# Patient Record
Sex: Male | Born: 1995 | Race: White | Hispanic: No | Marital: Single | Smoking: Never smoker
Health system: Southern US, Community
[De-identification: ages and names within clinical notes are randomized; demographics above are authoritative.]

## PROBLEM LIST (undated history)

## (undated) DIAGNOSIS — E669 Obesity, unspecified: Secondary | ICD-10-CM

## (undated) DIAGNOSIS — J020 Streptococcal pharyngitis: Secondary | ICD-10-CM

## (undated) HISTORY — DX: Obesity, unspecified: E66.9

---
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
## 2017-07-28 ENCOUNTER — Encounter (HOSPITAL_COMMUNITY): Payer: Self-pay | Admitting: Emergency Medicine

## 2017-07-28 ENCOUNTER — Ambulatory Visit (HOSPITAL_COMMUNITY)
Admission: EM | Admit: 2017-07-28 | Discharge: 2017-07-28 | Disposition: A | Discharge status: Home / Self Care | Payer: Self-pay | Attending: Family Medicine | Admitting: Family Medicine

## 2017-07-28 DIAGNOSIS — R51 Headache: Secondary | ICD-10-CM

## 2017-07-28 DIAGNOSIS — R42 Dizziness and giddiness: Secondary | ICD-10-CM

## 2017-07-28 DIAGNOSIS — R110 Nausea: Secondary | ICD-10-CM

## 2017-07-28 LAB — POCT I-STAT, CHEM 8
BUN: 18 mg/dL (ref 6–20)
Calcium, Ion: 1.2 mmol/L (ref 1.15–1.40)
Chloride: 104 mmol/L (ref 101–111)
Creatinine, Ser: 0.8 mg/dL (ref 0.61–1.24)
Glucose, Bld: 108 mg/dL — ABNORMAL HIGH (ref 65–99)
HCT: 46 % (ref 39.0–52.0)
Hemoglobin: 15.6 g/dL (ref 13.0–17.0)
Potassium: 4.2 mmol/L (ref 3.5–5.1)
Sodium: 141 mmol/L (ref 135–145)
TCO2: 25 mmol/L (ref 22–32)

## 2017-07-28 NOTE — ED Provider Notes (Signed)
[facility]    CSN: [id number] Arrival date & time: 07/28/17  1037     History   Chief Complaint Chief Complaint  Patient presents with  . Nausea    HPI Philip CunasBrandon Guilford is a 21 y.o. male presenting of 2 days of lightheadedness. He states the past 2 days he has woken up feeling lightheaded with associated mild nausea without vomiting and non-room spinning dizziness. Endorses mild headache, and fatigue. No blackening of vision/pre-syncope or syncopal episodes. No recent injury or trauma to head, not active in sports. No recent illness, fever, neck stiffness, cough, SOB, chest pain, abdominal pain, diarrhea, no blood in stool. Girlfriend with him states he has been eating less frequently and will go most of the day before eating. He works at [employer] as a Conservation officer, naturecashier. States he drinks 5-6 bottles of water a day. Has limited his red meat intake.   HPI  Past Medical History:  Diagnosis Date  . Obese   . Strep pharyngitis     Patient Active Problem List   Diagnosis Date Noted  . Obesity, unspecified 01/07/2014    History reviewed. No pertinent surgical history.     Home Medications    Prior to Admission medications   Medication Sig Start Date End Date Taking? Authorizing Provider  naproxen (NAPROSYN) 500 MG tablet Take 1 tablet (500 mg total) by mouth 2 (two) times daily. 03/27/16   Santiago GladLaisure, Heather, PA-C    Family History Family History  Problem Relation Age of Onset  . Hyperlipidemia Mother   . Hypertension Maternal Uncle   . Stroke Paternal Grandfather     Social History Social History   Tobacco Use  . Smoking status: Never Smoker  . Smokeless tobacco: Never Used  Substance Use Topics  . Alcohol use: No  . Drug use: No     Allergies   Patient has no known allergies.   Review of Systems Review of Systems  Constitutional: Positive for appetite change and fatigue. Negative for diaphoresis and fever.  HENT: Negative for congestion, ear pain  and sore throat.   Eyes: Negative for pain and visual disturbance.  Respiratory: Negative for cough and shortness of breath.   Cardiovascular: Negative for chest pain.  Gastrointestinal: Positive for nausea. Negative for abdominal pain, blood in stool, diarrhea and vomiting.  Endocrine: Negative for polydipsia, polyphagia and polyuria.  Genitourinary: Positive for frequency. Negative for difficulty urinating, discharge, dysuria, hematuria, penile pain and urgency.  Musculoskeletal: Negative for back pain, myalgias and neck stiffness.  Skin: Negative for rash.  Neurological: Positive for dizziness, light-headedness and headaches. Negative for syncope and speech difficulty.     Physical Exam Triage Vital Signs ED Triage Vitals [07/28/17 1102]  Enc Vitals Group     BP (!) 163/79     Pulse Rate 88     Resp 18     Temp 98.5 F (36.9 C)     Temp Source Oral     SpO2 98 %     Weight      Height      Head Circumference      Peak Flow      Pain Score      Pain Loc      Pain Edu?      Excl. in GC?    No data found.  Updated Vital Signs BP (!) 163/79 (BP Location: Right Arm)   Pulse 88   Temp 98.5 F (36.9 C) (Oral)   Resp 18  SpO2 98%    Physical Exam  Constitutional: He is oriented to person, place, and time. He appears well-developed and well-nourished. No distress.  HENT:  Head: Normocephalic and atraumatic.  Right Ear: Tympanic membrane and ear canal normal. No tenderness. Tympanic membrane is not erythematous.  Left Ear: Tympanic membrane and ear canal normal. No tenderness. Tympanic membrane is not erythematous.  Mouth/Throat: Uvula is midline, oropharynx is clear and moist and mucous membranes are normal. No oral lesions. No trismus in the jaw. No uvula swelling. No oropharyngeal exudate or posterior oropharyngeal erythema.  Eyes: Conjunctivae, EOM and lids are normal. Pupils are equal, round, and reactive to light. Right conjunctiva is not injected. Left conjunctiva  is not injected.  Neck: Normal range of motion. Neck supple. No Brudzinski's sign and no Kernig's sign noted.  Cardiovascular: Normal rate and regular rhythm.  No murmur heard. Pulmonary/Chest: Effort normal and breath sounds normal. No stridor. No respiratory distress. He has no decreased breath sounds.  Abdominal: Soft. He exhibits no distension. There is no tenderness. There is no guarding.  Neurological: He is alert and oriented to person, place, and time. He has normal reflexes. No cranial nerve deficit or sensory deficit.  Reflex Scores:      Bicep reflexes are 2+ on the right side and 2+ on the left side.      Patellar reflexes are 2+ on the right side and 2+ on the left side.    UC Treatments / Results  Labs (all labs ordered are listed, but only abnormal results are displayed) Labs Reviewed - No data to display  EKG  EKG Interpretation None       Radiology No results found.  Procedures Procedures (including critical care time)  Medications Ordered in UC Medications - No data to display   Initial Impression / Assessment and Plan / UC Course  I have reviewed the triage vital signs and the nursing notes.  Pertinent labs & imaging results that were available during my care of the patient were reviewed by me and considered in my medical decision making (see chart for details).     Patient presenting with lightheadedness with unclear cause. Istat check for glucose, electrolytes, and hemoglobin. All negative. No sign of infectious cause. Ear exam normal, atypical description for dizziness related to vertigo or inner ear cause. Advised patient to monitor symptoms for progression, stay hydrated, eat small frequent meals throughout the day, eat healthy diet with meat and vegetables. Discussed return precautions to include  fever, abdominal pain, passing out, chest pain, shortness of breath, neck stiffness, level of alertness, slurring of speech . Patient verbalized  understanding and is agreeable with plan.  Patient Discharge Instructions:  The blood we drew today checked electrolytes and hemoglobin (anemia marker) and everything look normal.   Nothing concerning based off your history or exam. As your symptoms have only gone on for 2 days I would like for you to watch your symptoms for any change or worsening. In the meantime, please stay hydrated- drink 7-8 bottles of water a day. Try to eat small frequent meals/snacks throughout the day. Try to eat a healthy balanced diet with meat and vegetables.   If you start to develop fever, abdominal pain, passing out, chest pain, shortness of breath, neck stiffness please return for re-evaluation.   Final Clinical Impressions(s) / UC Diagnoses   Final diagnoses:  None    ED Discharge Orders    None       Controlled Substance Prescriptions  [place] Controlled Substance Registry consulted? Not Applicable   Lew DawesWieters, Hallie C, [place] JerseyPA-C 07/28/17 1254

## 2017-07-28 NOTE — ED Triage Notes (Signed)
Pt sts nausea starting yesterday; denies vomiting

## 2017-07-28 NOTE — Discharge Instructions (Addendum)
The blood we drew today checked electrolytes and hemoglobin (anemia marker) and everything look normal.   Nothing concerning based off your history or exam. As your symptoms have only gone on for 2 days I would like for you to watch your symptoms for any change or worsening. In the meantime, please stay hydrated- drink 7-8 bottles of water a day. Try to eat small frequent meals/snacks throughout the day. Try to eat a healthy balanced diet with meat and vegetables.   If you start to develop fever, abdominal pain, passing out, chest pain, shortness of breath, neck stiffness please return for re-evaluation.

## 2018-07-28 IMAGING — CR DG ANKLE COMPLETE 3+V*R*
3 series · 3 of 3 positions shown · non-contrast
Comparison: None.

CLINICAL DATA: Patient status post stepping into a hole. Ankle
pain. Initial encounter.

EXAM:
RIGHT ANKLE - COMPLETE 3+ VIEW

[ankle ap]
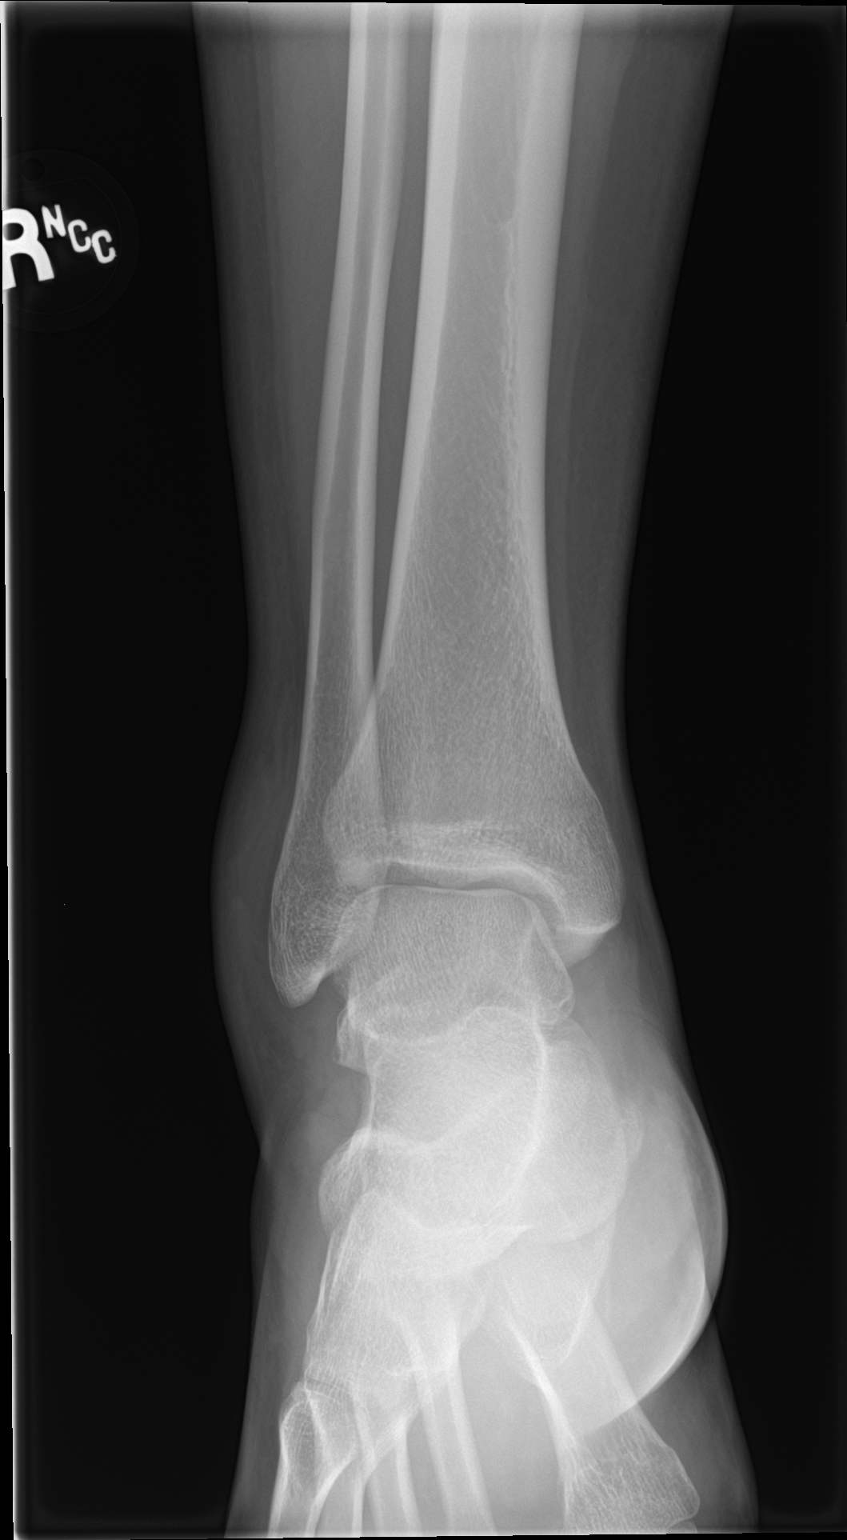

[ankle obl]
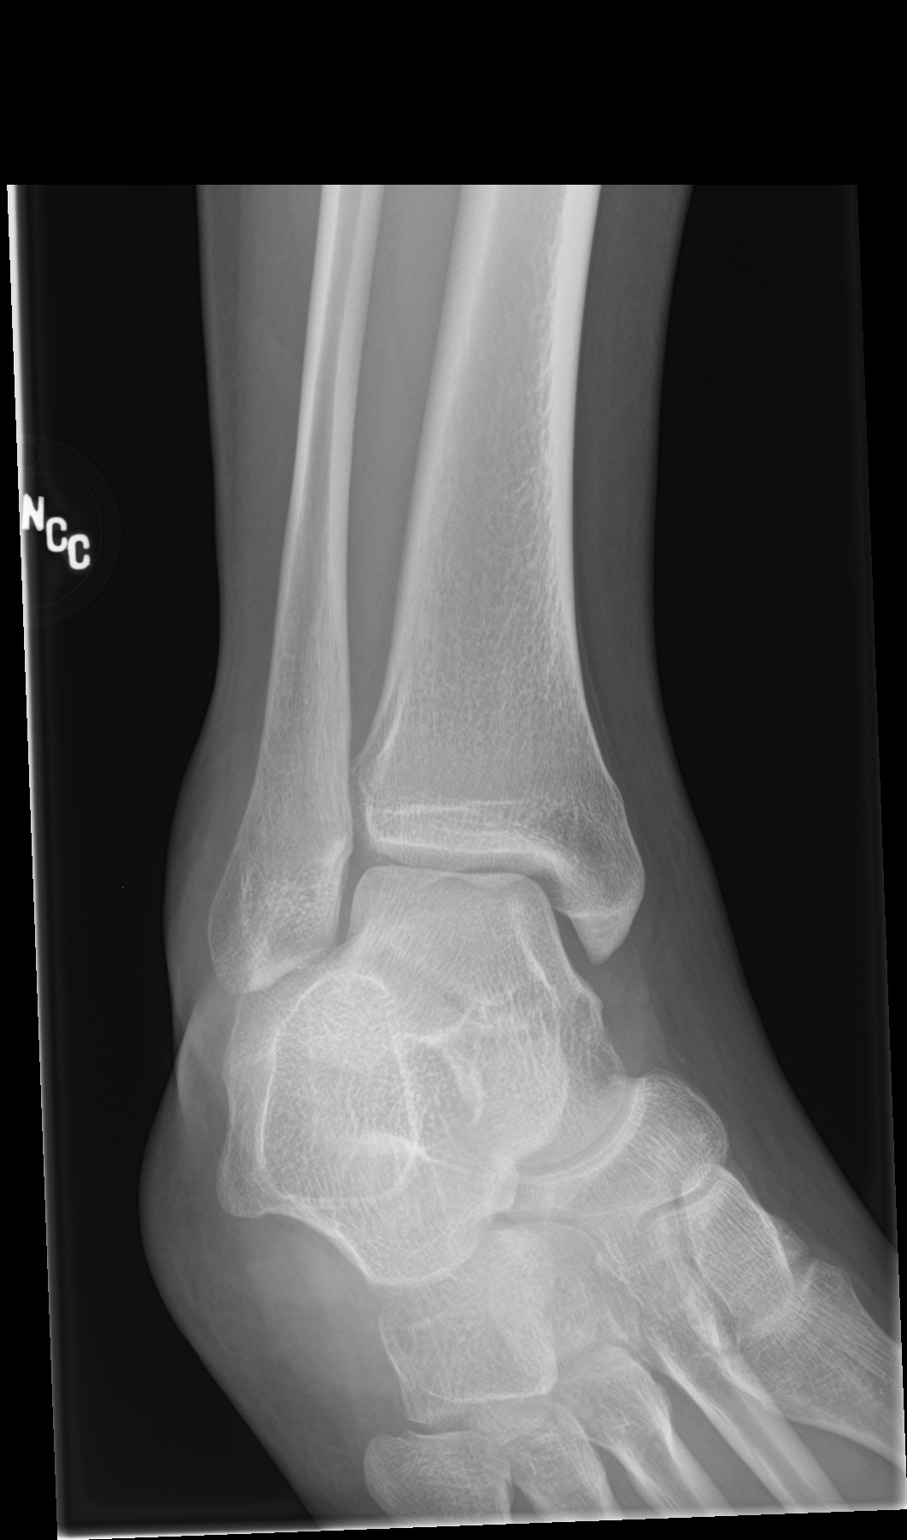

[ankle lat]
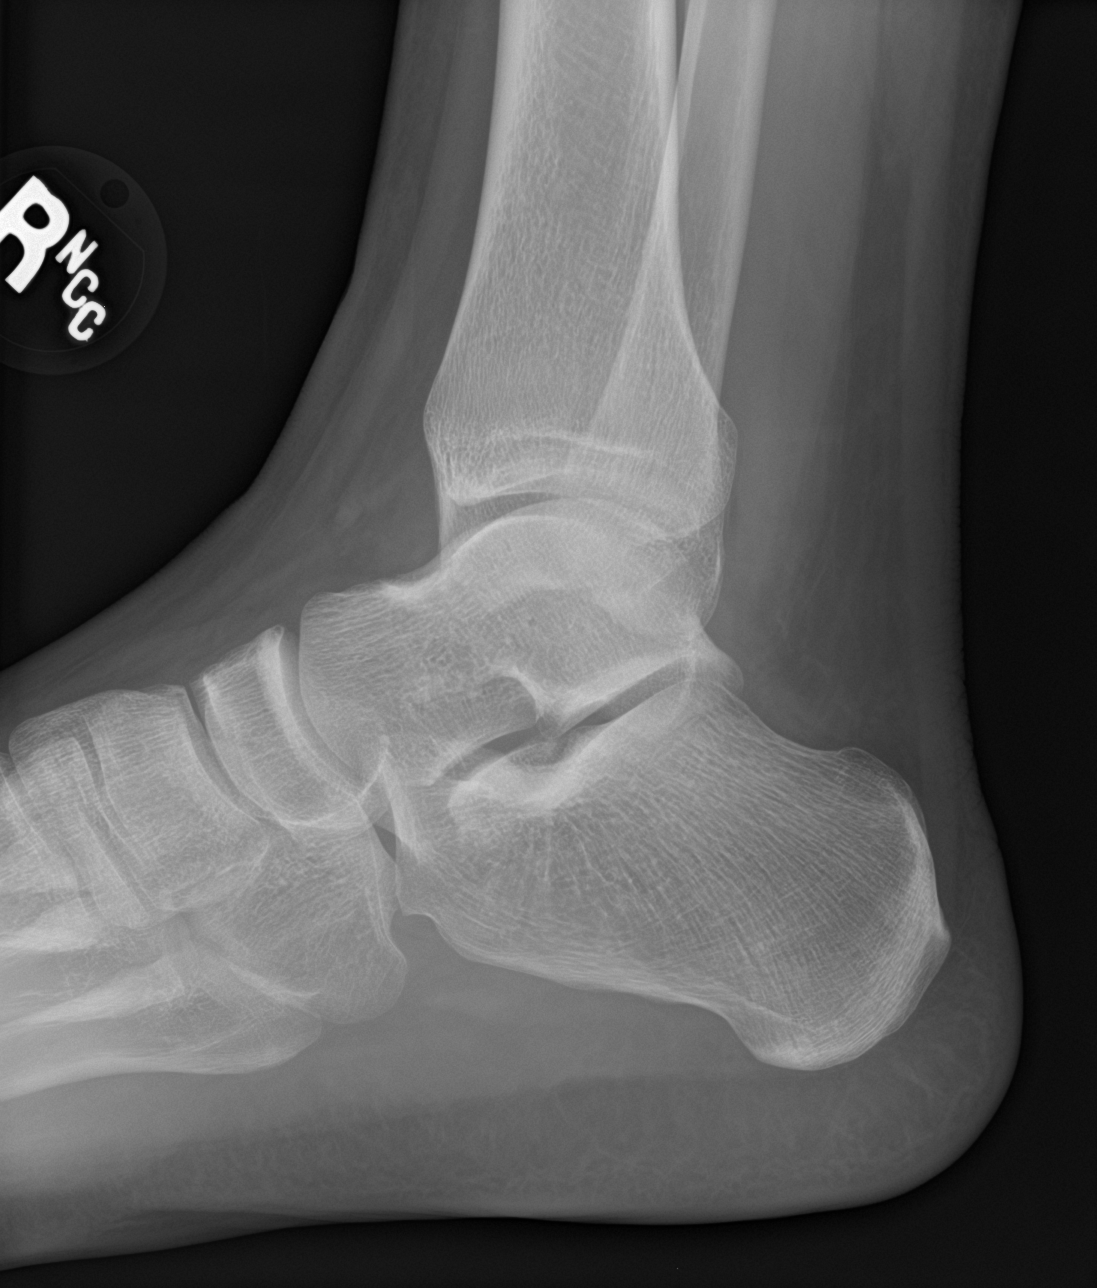

[3 of 3 positions shown; findings below may reference images not displayed]

FINDINGS: Normal anatomic alignment. No evidence for acute fracture or
dislocation. Soft tissue swelling about the lateral malleolus.
IMPRESSION: Soft tissue swelling about the lateral malleolus. No acute osseous
abnormality.

## 2019-07-23 ENCOUNTER — Other Ambulatory Visit: Payer: Self-pay

## 2019-07-23 DIAGNOSIS — Z20822 Contact with and (suspected) exposure to covid-19: Secondary | ICD-10-CM

## 2019-07-24 LAB — NOVEL CORONAVIRUS, NAA: SARS-CoV-2, NAA: NOT DETECTED

## 2022-03-03 ENCOUNTER — Ambulatory Visit (INDEPENDENT_AMBULATORY_CARE_PROVIDER_SITE_OTHER): Payer: Worker's Compensation

## 2022-03-03 ENCOUNTER — Ambulatory Visit (HOSPITAL_COMMUNITY)
Admission: RE | Admit: 2022-03-03 | Discharge: 2022-03-03 | Disposition: A | Discharge status: Home / Self Care | Payer: Worker's Compensation | Source: Ambulatory Visit | Attending: Physician Assistant | Admitting: Physician Assistant

## 2022-03-03 ENCOUNTER — Encounter (HOSPITAL_COMMUNITY): Payer: Self-pay

## 2022-03-03 VITALS — BP 125/85 | HR 88 | Temp 99.0°F | Resp 18

## 2022-03-03 DIAGNOSIS — S9001XA Contusion of right ankle, initial encounter: Secondary | ICD-10-CM | POA: Diagnosis not present

## 2022-03-03 DIAGNOSIS — S93401A Sprain of unspecified ligament of right ankle, initial encounter: Secondary | ICD-10-CM | POA: Diagnosis not present

## 2022-03-03 DIAGNOSIS — M25571 Pain in right ankle and joints of right foot: Secondary | ICD-10-CM

## 2022-03-03 NOTE — Discharge Instructions (Addendum)
Advised to use ankle support for the next couple days until the swelling and pain resolve. Advised to elevate and ice the area, 10 minutes on 20 minutes off, 4-5 times throughout the day to help reduce the pain and swelling over the next couple days. Advised take ibuprofen 600 mg every 6-8 hours as needed for pain and swelling relief. Advised to follow-up PCP or return to urgent care if symptoms fail to improve.

## 2022-03-03 NOTE — ED Provider Notes (Signed)
[facility]    CSN: [id number] Arrival date & time: 03/03/22  1043      History   Chief Complaint Chief Complaint  Patient presents with   Ankle Pain    Entered by patient    HPI Philip Zhang is a 26 y.o. male.   24-year-old male presents with right ankle pain.  Patient relates yesterday he was walking and stepped off a curve and inverted his right ankle.  Patient indicates since then he has been having right ankle swelling on the outside aspect, pain with standing, and walking.  Patient indicates when he fell he heard some pops and crackles with the injury.  Patient indicates he is not having any weakness, numbness or tingling.  Patient indicates he is getting some relief with the Ace wrap and with ibuprofen.  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  Patient relates that the cages usually are 50 pounds and he has to go up and down steps repeatedly.   Ankle Pain   Past Medical History:  Diagnosis Date   Obese    Strep pharyngitis     Patient Active Problem List   Diagnosis Date Noted   Obesity, unspecified 01/07/2014    History reviewed. No pertinent surgical history.     Home Medications    Prior to Admission medications   Medication Sig Start Date End Date Taking? Authorizing Provider  naproxen (NAPROSYN) 500 MG tablet Take 1 tablet (500 mg total) by mouth 2 (two) times daily. 03/27/16   Santiago Glad, PA-C    Family History Family History  Problem Relation Age of Onset   Hyperlipidemia Mother    Hypertension Maternal Uncle    Stroke Paternal Grandfather     Social History Social History   Tobacco Use   Smoking status: Never   Smokeless tobacco: Never  Substance Use Topics   Alcohol use: No   Drug use: No     Allergies   Patient has no known allergies.   Review of Systems Review of Systems  Musculoskeletal:  Positive for joint swelling (Right ankle pain and  swelling).     Physical Exam Triage Vital Signs ED Triage Vitals  Enc Vitals Group     BP 03/03/22 1109 125/85     Pulse Rate 03/03/22 1109 88     Resp 03/03/22 1109 18     Temp 03/03/22 1109 99 F (37.2 C)     Temp src --      SpO2 03/03/22 1109 97 %     Weight --      Height --      Head Circumference --      Peak Flow --      Pain Score 03/03/22 1108 6     Pain Loc --      Pain Edu? --      Excl. in GC? --    No data found.  Updated Vital Signs BP 125/85   Pulse 88   Temp 99 F (37.2 C)   Resp 18   SpO2 97%   Visual Acuity Right Eye Distance:   Left Eye Distance:   Bilateral Distance:    Right Eye Near:   Left Eye Near:    Bilateral Near:     Physical Exam Constitutional:      Appearance: Normal appearance.  Musculoskeletal:     Right ankle: Swelling (2 plus lateral malleolus area) and  ecchymosis (mild present inferior mallelous) present. Tenderness (lateral malleolus) present. Decreased range of motion. Anterior drawer test negative.       Legs:  Neurological:     Mental Status: He is alert.      UC Treatments / Results  Labs (all labs ordered are listed, but only abnormal results are displayed) Labs Reviewed - No data to display  EKG   Radiology DG Ankle Complete Right  Result Date: 03/03/2022 CLINICAL DATA:  Pain and swelling after twisting injury yesterday. EXAM: RIGHT ANKLE - COMPLETE 3+ VIEW COMPARISON:  03/27/2016 FINDINGS: Lateral malleolar soft tissue swelling. No acute fracture or dislocation. Base of fifth metatarsal and talar dome intact. IMPRESSION: Lateral soft tissue swelling only. Electronically Signed   By: Jeronimo Greaves M.D.   On: 03/03/2022 12:02    Procedures Procedures (including critical care time)  Medications Ordered in UC Medications - No data to display  Initial Impression / Assessment and Plan / UC Course  I have reviewed the triage vital signs and the nursing notes.  Pertinent labs & imaging results that were  available during my care of the patient were reviewed by me and considered in my medical decision making (see chart for details).    Plan: 1.  Advised take ibuprofen 600 mg every 6-8 hours as needed for pain relief. 2.  Advised to wear ankle support in order to increase activity. 3.  Advised elevation and icing on a regular basis to help reduce the pain and swelling. 4.  Advised to follow-up with PCP or return to urgent care if symptoms fail to improve. Final Clinical Impressions(s) / UC Diagnoses   Final diagnoses:  Sprain of right ankle, unspecified ligament, initial encounter  Acute right ankle pain  Contusion of right ankle, initial encounter     Discharge Instructions      Advised to use ankle support for the next couple days until the swelling and pain resolve. Advised to elevate and ice the area, 10 minutes on 20 minutes off, 4-5 times throughout the day to help reduce the pain and swelling over the next couple days. Advised take ibuprofen 600 mg every 6-8 hours as needed for pain and swelling relief. Advised to follow-up PCP or return to urgent care if symptoms fail to improve.    ED Prescriptions   None    PDMP not reviewed this encounter.   Ellsworth Lennox, PA-C 03/03/22 1207

## 2022-03-03 NOTE — ED Triage Notes (Signed)
Pt is present today with right ankle pain. Pt states that he tripped yesterday off a curb and felt a pop in his ankle.

## 2022-05-28 ENCOUNTER — Ambulatory Visit: Payer: 59 | Admitting: Physician Assistant

## 2024-05-03 ENCOUNTER — Other Ambulatory Visit (HOSPITAL_COMMUNITY): Payer: Self-pay

## 2024-05-03 MED ORDER — DOXYCYCLINE HYCLATE 100 MG PO CAPS
100.0000 mg | ORAL_CAPSULE | Freq: Two times a day (BID) | ORAL | 0 refills | Status: AC
  Filled 2024-05-03: qty 14, 7d supply, fill #0
